# Patient Record
Sex: Male | Born: 1995 | Race: White | Hispanic: No | Marital: Single | Smoking: Never smoker
Health system: Southern US, Community
[De-identification: ages and names within clinical notes are randomized; demographics above are authoritative.]

---
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
## 2017-12-12 ENCOUNTER — Ambulatory Visit (HOSPITAL_COMMUNITY): Admission: EM | Admit: 2017-12-12 | Discharge: 2017-12-12 | Discharge status: Against Medical Advice | Payer: Self-pay

## 2017-12-12 ENCOUNTER — Encounter (HOSPITAL_COMMUNITY): Payer: Self-pay | Admitting: Emergency Medicine

## 2017-12-12 ENCOUNTER — Emergency Department (HOSPITAL_COMMUNITY)
Admission: EM | Admit: 2017-12-12 | Discharge: 2017-12-12 | Disposition: A | Discharge status: Home / Self Care | Payer: BLUE CROSS/BLUE SHIELD | Attending: Emergency Medicine | Admitting: Emergency Medicine

## 2017-12-12 DIAGNOSIS — R55 Syncope and collapse: Secondary | ICD-10-CM | POA: Insufficient documentation

## 2017-12-12 LAB — BASIC METABOLIC PANEL
ANION GAP: 7 (ref 5–15)
BUN: 8 mg/dL (ref 6–20)
CALCIUM: 9.5 mg/dL (ref 8.9–10.3)
CHLORIDE: 104 mmol/L (ref 101–111)
CO2: 26 mmol/L (ref 22–32)
CREATININE: 0.98 mg/dL (ref 0.61–1.24)
GFR calc Af Amer: >60 mL/min (ref >60)
GFR calc non Af Amer: >60 mL/min (ref >60)
Glucose, Bld: 97 mg/dL (ref 65–99)
Potassium: 3.9 mmol/L (ref 3.5–5.1)
SODIUM: 137 mmol/L (ref 135–145)

## 2017-12-12 LAB — CBC
HCT: 42 % (ref 39.0–52.0)
HEMOGLOBIN: 14.6 g/dL (ref 13.0–17.0)
MCH: 31 pg (ref 26.0–34.0)
MCHC: 34.8 g/dL (ref 30.0–36.0)
MCV: 89.2 fL (ref 78.0–100.0)
PLATELETS: 280 10*3/uL (ref 150–400)
RBC: 4.71 MIL/uL (ref 4.22–5.81)
RDW: 12.4 % (ref 11.5–15.5)
WBC: 9.4 10*3/uL (ref 4.0–10.5)

## 2017-12-12 LAB — I-STAT TROPONIN, ED: TROPONIN I, POC: 0 ng/mL (ref 0.00–0.08)

## 2017-12-12 NOTE — ED Notes (Signed)
Pt verbalized understanding of d/c instructions and has no further questions, VSS, NAD. Pt to follow up with cardiology.  

## 2017-12-12 NOTE — ED Notes (Signed)
Patient confirms this is his old name on file, but he has been adopted since.  Working on getting a picture texted of his new ID.  No ID on him at this time.  Will more forward with triage.

## 2017-12-12 NOTE — Discharge Instructions (Signed)
Drink plenty of fluids Avoid caffeine Return if worse at any time Follow up with your doctor this week Follow up with cardiology asap
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
## 2017-12-12 NOTE — ED Triage Notes (Signed)
Pt presents to ED for assessment of several syncopal episodes this past year, all witnessed, one with possible seizure-like activity.  Patient c/o dizziness before the episodes, and head pressure after.  States near-syncopal episode this morning, witnessed by mother, who decided to bring him in.

## 2019-01-26 ENCOUNTER — Telehealth: Payer: BLUE CROSS/BLUE SHIELD | Admitting: Nurse Practitioner

## 2019-01-26 DIAGNOSIS — Z20822 Contact with and (suspected) exposure to covid-19: Secondary | ICD-10-CM

## 2019-01-26 NOTE — Progress Notes (Signed)
E-Visit for Corona Virus Screening   Your current symptoms could be consistent with the coronavirus.  Many health care providers can now test patients at their office but not all are.  [facility] has multiple testing sites. For information on our COVID testing locations and hours go to [email or web address]  Please quarantine yourself while awaiting your test results.  We are enrolling you in our [facility] [place] . Daily you will receive a questionnaire within the [place] website. Our COVID 19 response team willl be monitoriing your responses daily.    COVID-19 is a respiratory illness with symptoms that are similar to the flu. Symptoms are typically mild to moderate, but there have been cases of severe illness and death due to the virus. The following symptoms may appear 2-14 days after exposure: . Fever . Cough . Shortness of breath or difficulty breathing . Chills . Repeated shaking with chills . Muscle pain . Headache . Sore throat . New loss of taste or smell . Fatigue . Congestion or runny nose . Nausea or vomiting . Diarrhea  It is vitally important that if you feel that you have an infection such as this virus or any other virus that you stay home and away from places where you may spread it to others.  You should self-quarantine for 14 days if you have symptoms that could potentially be coronavirus or have been in close contact a with a person diagnosed with COVID-19 within the last 2 weeks. You should avoid contact with people age 71 and older.   You should wear a mask or cloth face covering over your nose and mouth if you must be around other people or animals, including pets (even at home). Try to stay at least 6 feet away from other people. This will protect the people around you.  You can use medication such as delsym if cough develops   You may also take acetaminophen (Tylenol) as needed for fever.   Reduce your risk of any  infection by using the same precautions used for avoiding the common cold or flu:  Marland Kitchen Wash your hands often with soap and warm water for at least 20 seconds.  If soap and water are not readily available, use an alcohol-based hand sanitizer with at least 60% alcohol.  . If coughing or sneezing, cover your mouth and nose by coughing or sneezing into the elbow areas of your shirt or coat, into a tissue or into your sleeve (not your hands). . Avoid shaking hands with others and consider head nods or verbal greetings only. . Avoid touching your eyes, nose, or mouth with unwashed hands.  . Avoid close contact with people who are sick. . Avoid places or events with large numbers of people in one location, like concerts or sporting events. . Carefully consider travel plans you have or are making. . If you are planning any travel outside or inside the [place], visit the CDC's Travelers' Health webpage for the latest health notices. . If you have some symptoms but not all symptoms, continue to monitor at home and seek medical attention if your symptoms worsen. . If you are having a medical emergency, call 911.  HOME CARE . Only take medications as instructed by your medical team. . Drink plenty of fluids and get plenty of rest. . A steam or ultrasonic humidifier can help if you have congestion.   GET HELP RIGHT AWAY IF YOU HAVE EMERGENCY WARNING SIGNS** FOR COVID-19. If you or someone  is showing any of these signs seek emergency medical care immediately. Call 911 or proceed to your closest emergency facility if: . You develop worsening high fever. . Trouble breathing . Bluish lips or face . Persistent pain or pressure in the chest . New confusion . Inability to wake or stay awake . You cough up blood. . Your symptoms become more severe  **This list is not all possible symptoms. Contact your medical provider for any symptoms that are sever or concerning to you.   MAKE SURE YOU   Understand these  instructions.  Will watch your condition.  Will get help right away if you are not doing well or get worse.  Your e-visit answers were reviewed by a board certified advanced clinical practitioner to complete your personal care plan.  Depending on the condition, your plan could have included both over the counter or prescription medications.  If there is a problem please reply once you have received a response from your provider.  Your safety is important to us.  If you have drug allergies check your prescription carefully.    You can use MyChart to ask questions about today's visit, request a non-urgent call back, or ask for a work or school excuse for 24 hours related to this e-Visit. If it has been greater than 24 hours you will need to follow up with your provider, or enter a new e-Visit to address those concerns. You will get an e-mail in the next two days asking about your experience.  I hope that your e-visit has been valuable and will speed your recovery. Thank you for using e-visits.  5-10 minutes spent reviewing and documenting in chart.

## 2022-07-30 ENCOUNTER — Other Ambulatory Visit: Payer: Self-pay

## 2022-07-30 ENCOUNTER — Ambulatory Visit
Admission: RE | Admit: 2022-07-30 | Discharge: 2022-07-30 | Disposition: A | Discharge status: Home / Self Care | Payer: No Typology Code available for payment source | Source: Ambulatory Visit | Attending: Nurse Practitioner | Admitting: Nurse Practitioner

## 2022-07-30 DIAGNOSIS — M79674 Pain in right toe(s): Secondary | ICD-10-CM
